# Patient Record
Sex: Male | Born: 1955 | Race: White | Hispanic: No | Marital: Married | Smoking: Never smoker
Health system: Southern US, Community
[De-identification: ages and names within clinical notes are randomized; demographics above are authoritative.]

## PROBLEM LIST (undated history)

## (undated) DIAGNOSIS — E785 Hyperlipidemia, unspecified: Secondary | ICD-10-CM

## (undated) HISTORY — PX: VASECTOMY: SHX75

## (undated) HISTORY — DX: Hyperlipidemia, unspecified: E78.5

---

## 2005-01-10 ENCOUNTER — Ambulatory Visit (HOSPITAL_COMMUNITY): Admission: RE | Admit: 2005-01-10 | Discharge: 2005-01-10 | Payer: Self-pay | Admitting: *Deleted

## 2007-09-07 ENCOUNTER — Emergency Department (HOSPITAL_COMMUNITY): Admission: EM | Admit: 2007-09-07 | Discharge: 2007-09-07 | Payer: Self-pay | Admitting: Emergency Medicine

## 2010-10-30 ENCOUNTER — Other Ambulatory Visit: Payer: Self-pay | Admitting: Internal Medicine

## 2010-10-30 DIAGNOSIS — J910 Malignant pleural effusion: Secondary | ICD-10-CM

## 2010-11-01 ENCOUNTER — Ambulatory Visit (HOSPITAL_COMMUNITY): Admission: RE | Admit: 2010-11-01 | Payer: Self-pay | Source: Ambulatory Visit

## 2010-12-13 NOTE — H&P (Signed)
NAME:  Thomas Benjamin, Thomas Benjamin NO.:  [id number]   MEDICAL RECORD NO.:  [id number]           PATIENT TYPE:  AMB   LOCATION:                                FACILITY:  [facility]   PHYSICIAN:  Dalia Heading, M.D.  DATE OF BIRTH:  1956/01/03   DATE OF ADMISSION:  DATE OF DISCHARGE:  LH                                HISTORY & PHYSICAL   CHIEF COMPLAINT:  Need for screening colonoscopy.   HISTORY OF PRESENT ILLNESS:  The patient is a 55 year old white male who is  referred for endoscopic evaluation.  He needs a colonoscopy for screening  purposes.  No abdominal pain, weight loss, nausea, vomiting, diarrhea,  constipation, melena, hematochezia have been noted. He has never had a  colonoscopy.  There is no family history of colon carcinoma.   PAST MEDICAL HISTORY:  Unremarkable.   PAST SURGICAL HISTORY:  Unremarkable.   CURRENT MEDICATIONS:  None.   ALLERGIES:  No known drug allergies.   REVIEW OF SYMPTOMS:  Noncontributory.   PHYSICAL EXAMINATION:  GENERAL APPEARANCE:  The patient is a well-developed,  well-nourished white male in no acute distress.  VITAL SIGNS:  Patient is afebrile and other vital signs are stable.  LUNGS:  Clear to auscultation with equal breath sounds bilaterally.  HEART:  Examination reveals regular rate and rhythm without S3, S4 or  murmurs.  ABDOMEN:  Soft, nontender, nondistended, no hepatosplenomegaly or masses are  noted.  Rectal examination is deferred to the procedure.   IMPRESSION:  Need for screening colonoscopy.   PLAN:  The patient is scheduled for a colonoscopy on January 10, 2005.  The  risks and benefits of the procedure including bleeding and perforation were  fully explained to the patient, who gave informed consent.       MAJ/MEDQ  D:  01/09/2005  T:  01/09/2005  Job:  [id number]   cc:   Kirk Ruths, M.D.  [address]  [place]  [place] [address]  Fax: [phone]

## 2018-11-25 DIAGNOSIS — Z6827 Body mass index (BMI) 27.0-27.9, adult: Secondary | ICD-10-CM | POA: Diagnosis not present

## 2018-11-25 DIAGNOSIS — Z0000 Encounter for general adult medical examination without abnormal findings: Secondary | ICD-10-CM | POA: Diagnosis not present

## 2018-11-25 DIAGNOSIS — Z125 Encounter for screening for malignant neoplasm of prostate: Secondary | ICD-10-CM | POA: Diagnosis not present

## 2018-11-25 DIAGNOSIS — E663 Overweight: Secondary | ICD-10-CM | POA: Diagnosis not present

## 2018-11-25 DIAGNOSIS — Z1389 Encounter for screening for other disorder: Secondary | ICD-10-CM | POA: Diagnosis not present

## 2019-01-17 ENCOUNTER — Other Ambulatory Visit: Payer: Self-pay

## 2019-01-17 ENCOUNTER — Emergency Department (HOSPITAL_COMMUNITY)
Admission: EM | Admit: 2019-01-17 | Discharge: 2019-01-17 | Disposition: A | Discharge status: Home / Self Care | Payer: Medicare HMO | Attending: Emergency Medicine | Admitting: Emergency Medicine

## 2019-01-17 ENCOUNTER — Emergency Department (HOSPITAL_COMMUNITY): Payer: Medicare HMO

## 2019-01-17 DIAGNOSIS — R51 Headache: Secondary | ICD-10-CM | POA: Diagnosis not present

## 2019-01-17 DIAGNOSIS — W228XXA Striking against or struck by other objects, initial encounter: Secondary | ICD-10-CM | POA: Diagnosis not present

## 2019-01-17 DIAGNOSIS — S0990XA Unspecified injury of head, initial encounter: Secondary | ICD-10-CM | POA: Insufficient documentation

## 2019-01-17 DIAGNOSIS — Y939 Activity, unspecified: Secondary | ICD-10-CM | POA: Diagnosis not present

## 2019-01-17 DIAGNOSIS — Y93H3 Activity, building and construction: Secondary | ICD-10-CM | POA: Diagnosis not present

## 2019-01-17 DIAGNOSIS — Y999 Unspecified external cause status: Secondary | ICD-10-CM | POA: Insufficient documentation

## 2019-01-17 DIAGNOSIS — Y929 Unspecified place or not applicable: Secondary | ICD-10-CM | POA: Diagnosis not present

## 2019-01-17 NOTE — Discharge Instructions (Addendum)
Your CT scan here today was normal.  Take Tylenol and Motrin for pain.  Return here as needed.

## 2019-01-17 NOTE — ED Triage Notes (Signed)
Pt. Stated, I was working on a building and a piece of wood fell on  The top of my head . On Friday I started having a headache about 24 hours later. Ive had a headache ever since off and on.Some nausea but not much. I was checking to see if I have a concussion.

## 2019-01-17 NOTE — ED Notes (Signed)
Pt verbalized understanding of d/c instructions and has no further questions, VSS, NAD.  

## 2019-01-17 NOTE — ED Provider Notes (Signed)
  [facility] EMERGENCY DEPARTMENT Provider Note   CSN: [id number] Arrival date & time: 01/17/19  0720     History   Chief Complaint Chief Complaint  Patient presents with  . Headache  . Head Injury    HPI Thomas Benjamin is a 63 y.o. male.     HPI Patient presents to the emergency department with headache following a head injury that occurred on Thursday.  The patient states that he is building a shed in his backyard when he got hit in the head with a board that had a nail sticking out.  Patient states he did not have any laceration or wound to the head.  Patient states initially he did not have headache but developed this the next day.  Patient states that he did not take any medications prior to arrival for his symptoms.  Patient was concerned because he continues to have this waxing and waning headache since that time.  The patient denies chest pain, shortness of breath, blurred vision, neck pain, fever,  weakness, numbness, dizziness, anorexia, nausea, vomiting, near syncope, or syncope. No past medical history on file.  Patient Active Problem List   Diagnosis Date Noted  . 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  Social History Social History   Tobacco Use  . Smoking status: Not on file  Substance Use Topics  . Alcohol use: Not on file  . Drug use: Not on file     Allergies   Patient has no allergy information on record.   Review of Systems Review of Systems   Physical Exam Updated Vital Signs BP (!) 141/80   Pulse 78   Temp 99.4 F (37.4 C)   Resp 17   SpO2 95%   Physical Exam HENT:     Head:       ED Treatments / Results  Labs (all labs ordered are listed, but only abnormal results are displayed) Labs Reviewed - No data to display  EKG    Radiology No results found.  Procedures Procedures  (including critical care time)  Medications Ordered in ED Medications - No data to display   Initial Impression / Assessment and Plan / ED Course  I have reviewed the triage vital signs and the nursing notes.  Pertinent labs & imaging results that were available during my care of the patient were reviewed by me and considered in my medical decision making (see chart for details).        Patient will be treated for minor head injury.  The patient CT scan did not show any significant abnormalities at this time.  Patient is neurologically intact.  Patient will be advised to use Tylenol and Motrin for any headaches.  Final Clinical Impressions(s) / ED Diagnoses   Final diagnoses:  None    ED Discharge Orders    None       Dalia Heading, PA-C 01/17/19 [place], MD 01/17/19 1144

## 2019-01-20 DIAGNOSIS — W57XXXA Bitten or stung by nonvenomous insect and other nonvenomous arthropods, initial encounter: Secondary | ICD-10-CM | POA: Diagnosis not present

## 2019-01-20 DIAGNOSIS — E663 Overweight: Secondary | ICD-10-CM | POA: Diagnosis not present

## 2019-01-20 DIAGNOSIS — R51 Headache: Secondary | ICD-10-CM | POA: Diagnosis not present

## 2019-01-20 DIAGNOSIS — Z6827 Body mass index (BMI) 27.0-27.9, adult: Secondary | ICD-10-CM | POA: Diagnosis not present

## 2019-01-20 DIAGNOSIS — R509 Fever, unspecified: Secondary | ICD-10-CM | POA: Diagnosis not present

## 2019-02-01 ENCOUNTER — Ambulatory Visit (INDEPENDENT_AMBULATORY_CARE_PROVIDER_SITE_OTHER): Payer: Medicare HMO | Admitting: Urology

## 2019-02-01 DIAGNOSIS — R972 Elevated prostate specific antigen [PSA]: Secondary | ICD-10-CM | POA: Diagnosis not present

## 2019-02-02 ENCOUNTER — Other Ambulatory Visit (HOSPITAL_COMMUNITY): Payer: Self-pay | Admitting: Urology

## 2019-02-02 DIAGNOSIS — R972 Elevated prostate specific antigen [PSA]: Secondary | ICD-10-CM

## 2019-02-22 ENCOUNTER — Encounter (HOSPITAL_COMMUNITY): Payer: Self-pay

## 2019-02-22 ENCOUNTER — Other Ambulatory Visit: Payer: Self-pay | Admitting: Urology

## 2019-02-22 ENCOUNTER — Ambulatory Visit (HOSPITAL_COMMUNITY)
Admission: RE | Admit: 2019-02-22 | Discharge: 2019-02-22 | Disposition: A | Discharge status: Home / Self Care | Payer: Medicare HMO | Source: Ambulatory Visit | Attending: Urology | Admitting: Urology

## 2019-02-22 ENCOUNTER — Other Ambulatory Visit: Payer: Self-pay

## 2019-02-22 DIAGNOSIS — R972 Elevated prostate specific antigen [PSA]: Secondary | ICD-10-CM

## 2019-02-22 DIAGNOSIS — C61 Malignant neoplasm of prostate: Secondary | ICD-10-CM | POA: Diagnosis not present

## 2019-02-22 DIAGNOSIS — N4232 Atypical small acinar proliferation of prostate: Secondary | ICD-10-CM | POA: Diagnosis not present

## 2019-02-22 MED ORDER — GENTAMICIN SULFATE 40 MG/ML IJ SOLN
INTRAMUSCULAR | Status: AC
  Administered 2019-02-22: 160 mg via INTRAMUSCULAR
  Filled 2019-02-22: qty 4

## 2019-02-22 MED ORDER — LIDOCAINE HCL (PF) 2 % IJ SOLN
INTRAMUSCULAR | Status: AC
  Administered 2019-02-22: 10 mL
  Filled 2019-02-22: qty 10

## 2019-02-22 MED ORDER — LIDOCAINE HCL (PF) 2 % IJ SOLN
10.0000 mL | Freq: Once | INTRAMUSCULAR | Status: AC
  Administered 2019-02-22: 10 mL

## 2019-02-22 MED ORDER — GENTAMICIN SULFATE 40 MG/ML IJ SOLN
160.0000 mg | Freq: Once | INTRAMUSCULAR | Status: AC
  Administered 2019-02-22: 160 mg via INTRAMUSCULAR

## 2019-03-08 ENCOUNTER — Ambulatory Visit (INDEPENDENT_AMBULATORY_CARE_PROVIDER_SITE_OTHER): Payer: Medicare HMO | Admitting: Urology

## 2019-03-08 DIAGNOSIS — C61 Malignant neoplasm of prostate: Secondary | ICD-10-CM | POA: Diagnosis not present

## 2019-03-18 DIAGNOSIS — N4232 Atypical small acinar proliferation of prostate: Secondary | ICD-10-CM | POA: Diagnosis not present

## 2019-03-18 DIAGNOSIS — C61 Malignant neoplasm of prostate: Secondary | ICD-10-CM | POA: Diagnosis not present

## 2019-04-14 DIAGNOSIS — C61 Malignant neoplasm of prostate: Secondary | ICD-10-CM | POA: Diagnosis not present

## 2019-04-14 DIAGNOSIS — Z01818 Encounter for other preprocedural examination: Secondary | ICD-10-CM | POA: Diagnosis not present

## 2019-04-27 DIAGNOSIS — Z20828 Contact with and (suspected) exposure to other viral communicable diseases: Secondary | ICD-10-CM | POA: Diagnosis not present

## 2019-04-27 DIAGNOSIS — C61 Malignant neoplasm of prostate: Secondary | ICD-10-CM | POA: Diagnosis not present

## 2019-05-10 DIAGNOSIS — S80819A Abrasion, unspecified lower leg, initial encounter: Secondary | ICD-10-CM | POA: Diagnosis not present

## 2019-05-10 DIAGNOSIS — Z79899 Other long term (current) drug therapy: Secondary | ICD-10-CM | POA: Diagnosis not present

## 2019-05-10 DIAGNOSIS — N393 Stress incontinence (female) (male): Secondary | ICD-10-CM | POA: Diagnosis not present

## 2019-05-10 DIAGNOSIS — Z8546 Personal history of malignant neoplasm of prostate: Secondary | ICD-10-CM | POA: Diagnosis not present

## 2019-05-10 DIAGNOSIS — Z9079 Acquired absence of other genital organ(s): Secondary | ICD-10-CM | POA: Diagnosis not present

## 2019-05-10 DIAGNOSIS — N5231 Erectile dysfunction following radical prostatectomy: Secondary | ICD-10-CM | POA: Diagnosis not present

## 2019-05-10 DIAGNOSIS — C61 Malignant neoplasm of prostate: Secondary | ICD-10-CM | POA: Diagnosis not present

## 2019-05-10 DIAGNOSIS — Z08 Encounter for follow-up examination after completed treatment for malignant neoplasm: Secondary | ICD-10-CM | POA: Diagnosis not present

## 2019-05-10 DIAGNOSIS — Z4889 Encounter for other specified surgical aftercare: Secondary | ICD-10-CM | POA: Diagnosis not present

## 2019-05-10 DIAGNOSIS — Z803 Family history of malignant neoplasm of breast: Secondary | ICD-10-CM | POA: Diagnosis not present

## 2019-05-10 DIAGNOSIS — L089 Local infection of the skin and subcutaneous tissue, unspecified: Secondary | ICD-10-CM | POA: Diagnosis not present

## 2019-05-10 DIAGNOSIS — N3946 Mixed incontinence: Secondary | ICD-10-CM | POA: Diagnosis not present

## 2019-05-10 DIAGNOSIS — X58XXXA Exposure to other specified factors, initial encounter: Secondary | ICD-10-CM | POA: Diagnosis not present

## 2019-08-05 DIAGNOSIS — N5231 Erectile dysfunction following radical prostatectomy: Secondary | ICD-10-CM | POA: Diagnosis not present

## 2019-08-05 DIAGNOSIS — N3946 Mixed incontinence: Secondary | ICD-10-CM | POA: Diagnosis not present

## 2019-08-05 DIAGNOSIS — C61 Malignant neoplasm of prostate: Secondary | ICD-10-CM | POA: Diagnosis not present

## 2019-09-17 DIAGNOSIS — C61 Malignant neoplasm of prostate: Secondary | ICD-10-CM | POA: Diagnosis not present

## 2019-11-15 DIAGNOSIS — N3946 Mixed incontinence: Secondary | ICD-10-CM | POA: Diagnosis not present

## 2019-11-15 DIAGNOSIS — N5231 Erectile dysfunction following radical prostatectomy: Secondary | ICD-10-CM | POA: Diagnosis not present

## 2019-11-15 DIAGNOSIS — C61 Malignant neoplasm of prostate: Secondary | ICD-10-CM | POA: Diagnosis not present

## 2019-12-05 DIAGNOSIS — R69 Illness, unspecified: Secondary | ICD-10-CM | POA: Diagnosis not present

## 2019-12-14 DIAGNOSIS — R69 Illness, unspecified: Secondary | ICD-10-CM | POA: Diagnosis not present

## 2020-01-27 IMAGING — CT CT HEAD WITHOUT CONTRAST
4 series · 17 of 47 positions shown, 19 images · non-contrast
Comparison: None.

CLINICAL DATA: Posttraumatic headache due to fall a week ago

EXAM:
CT HEAD WITHOUT CONTRAST
TECHNIQUE: Contiguous axial images were obtained from the base of the skull
through the vertex without intravenous contrast.

[Series 3: head without · axial · non-contrast · 0.44mm/px · z∈[-94,+31]mm · 7 of 35 slices shown, 9 images]
[im 5/35  brain]
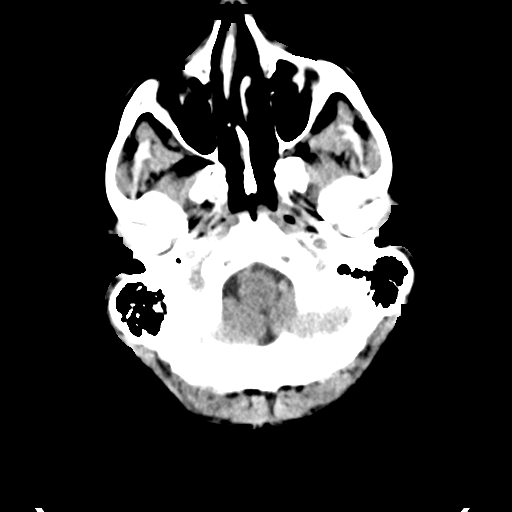
[im 5/35  bone]
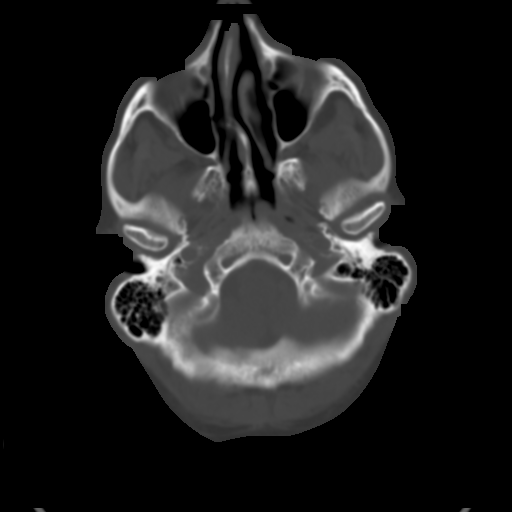
[im 9/35  brain]
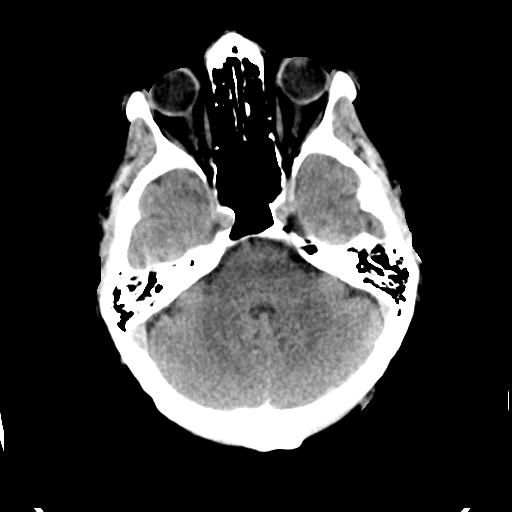
[im 13/35  brain]
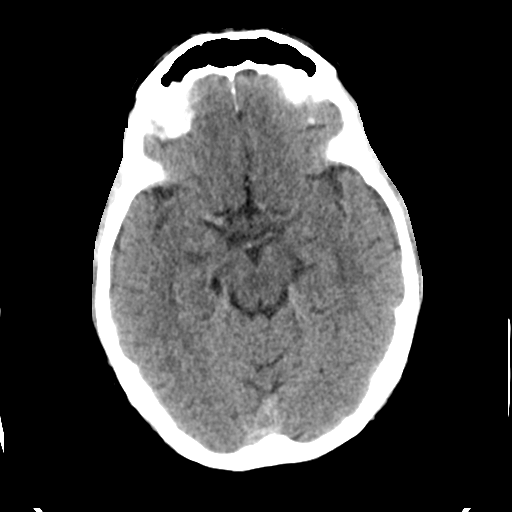
[im 18/35  brain]
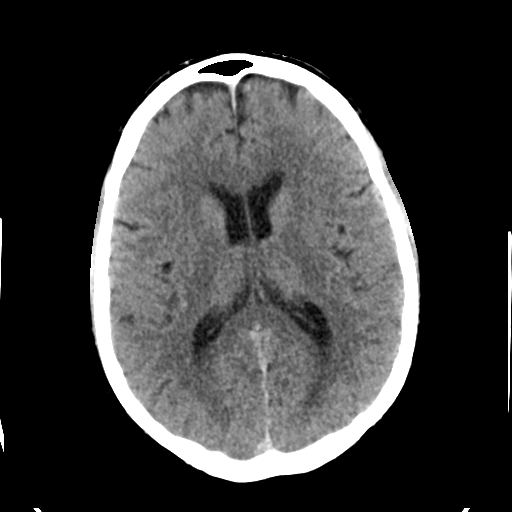
[im 22/35  brain]
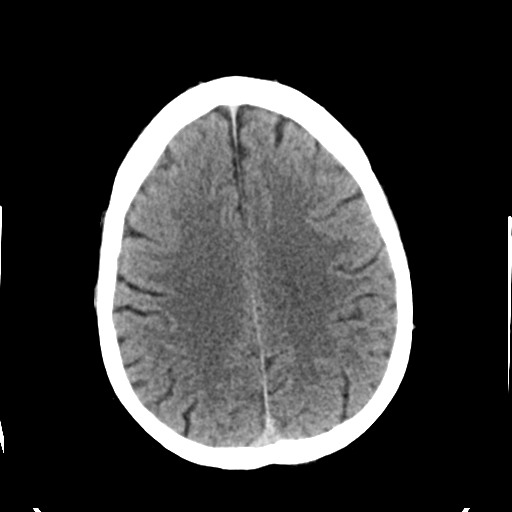
[im 22/35  bone]
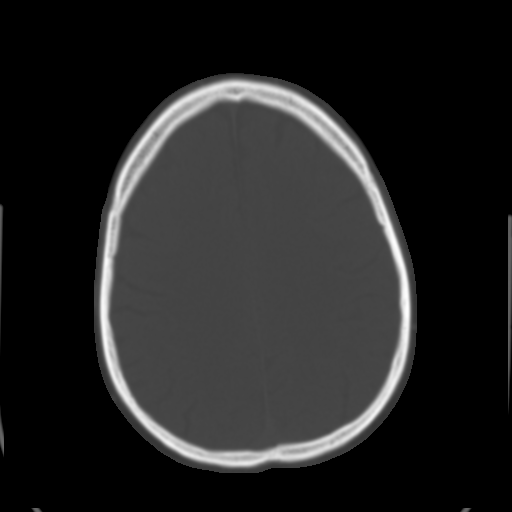
[im 26/35  brain]
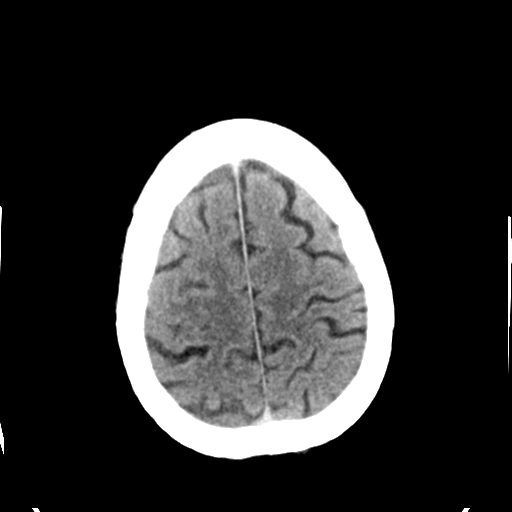
[im 30/35  brain]
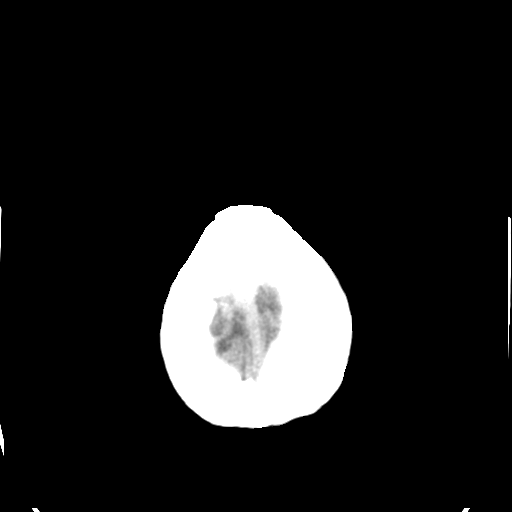

[Series 4: head bone · axial · 0.44mm/px · z∈[-98,-38]mm · 4 of 87 slices shown]
[im 9/87  bone]
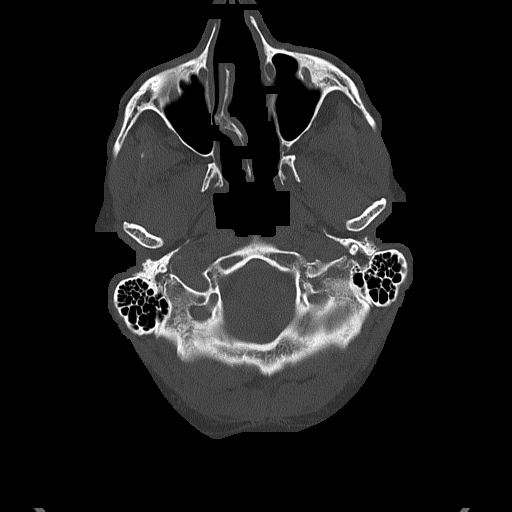
[im 18/87  bone]
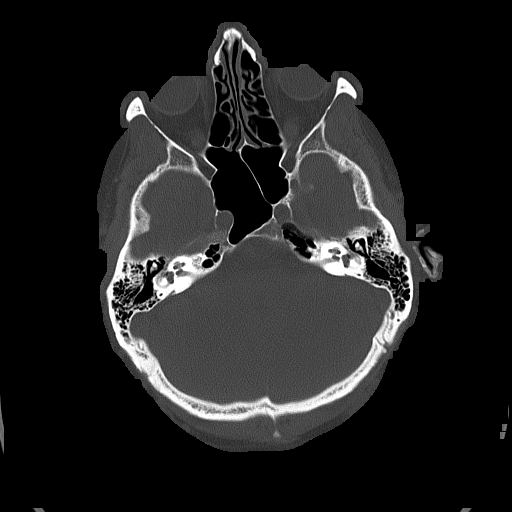
[im 26/87  bone]
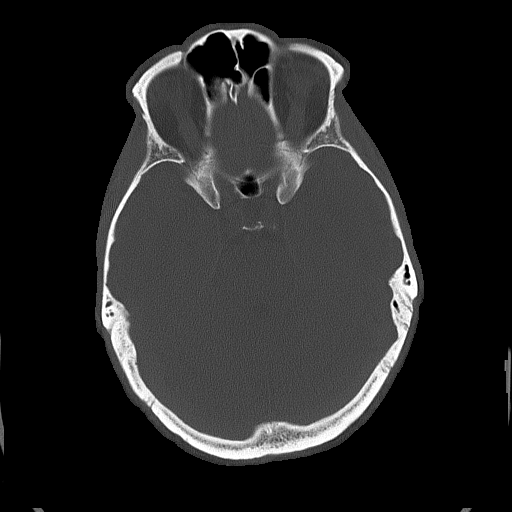
[im 39/87  bone]
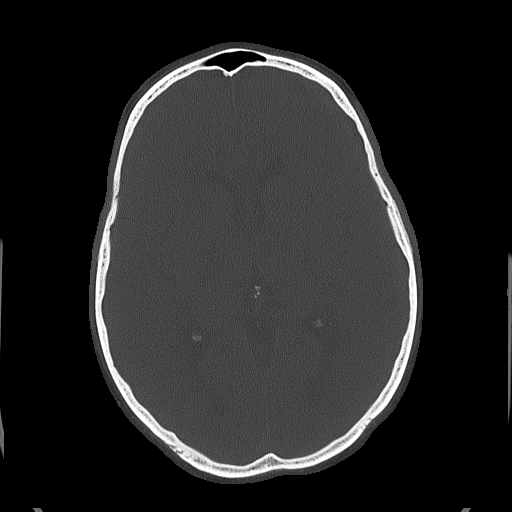

[Series 5: head without cor · coronal · non-contrast · 0.34mm/px · 3 of 74 slices shown]
[im 25/74  brain]
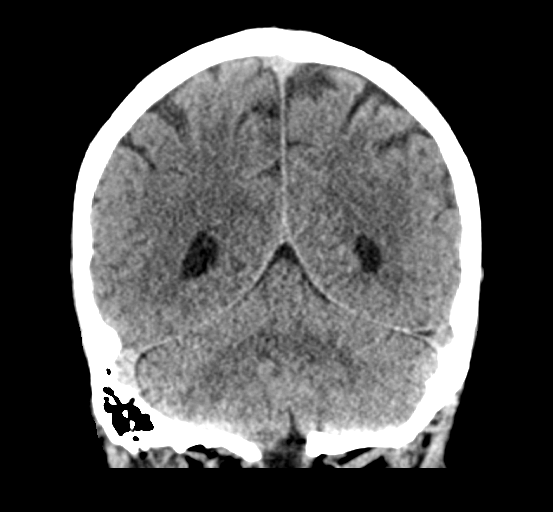
[im 33/74  brain]
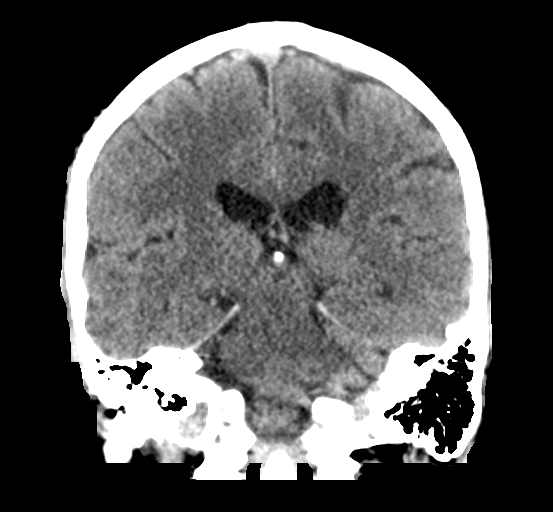
[im 41/74  brain]
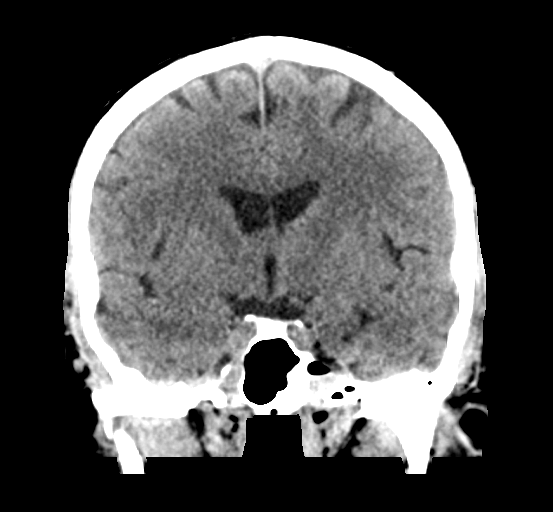

[Series 6: head without sag · sagittal · non-contrast · 0.34mm/px · 3 of 67 slices shown]
[im 23/67  brain]
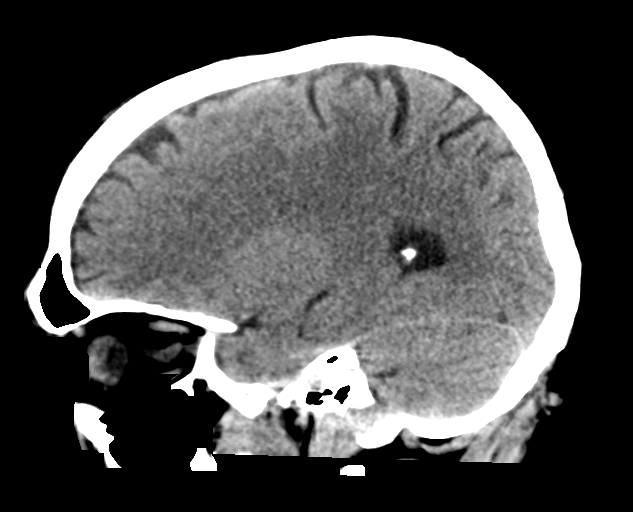
[im 34/67  brain]
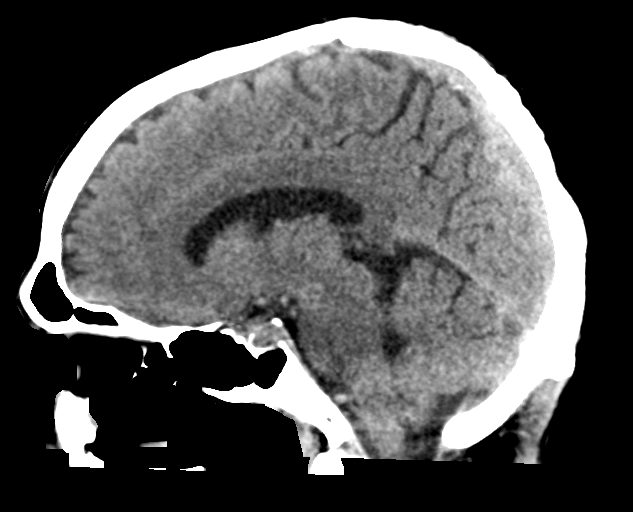
[im 45/67  brain]
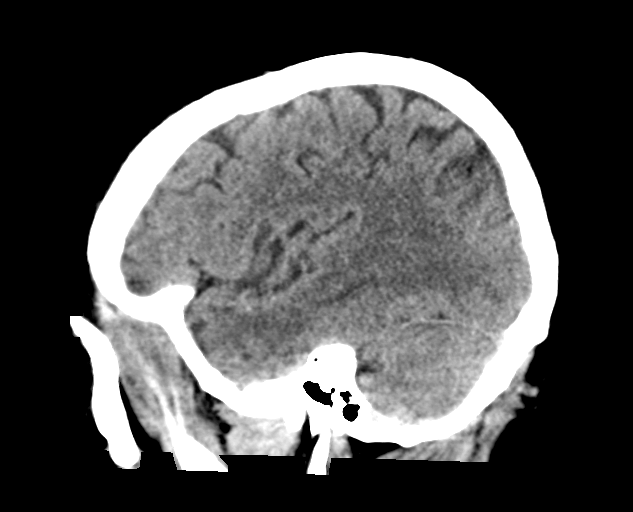

[17 of 47 positions shown; findings below may reference images not displayed]

FINDINGS: Brain: No evidence of acute infarction, hemorrhage, hydrocephalus,
extra-axial collection or mass lesion/mass effect.

Vascular: No hyperdense vessel or unexpected calcification.

Skull: Normal. Negative for fracture or focal lesion.

Sinuses/Orbits: No acute finding.
IMPRESSION: Negative head CT

## 2020-02-14 DIAGNOSIS — C61 Malignant neoplasm of prostate: Secondary | ICD-10-CM | POA: Diagnosis not present

## 2020-02-14 DIAGNOSIS — N5231 Erectile dysfunction following radical prostatectomy: Secondary | ICD-10-CM | POA: Diagnosis not present

## 2020-08-30 DIAGNOSIS — C61 Malignant neoplasm of prostate: Secondary | ICD-10-CM | POA: Diagnosis not present

## 2020-08-30 DIAGNOSIS — N5231 Erectile dysfunction following radical prostatectomy: Secondary | ICD-10-CM | POA: Diagnosis not present

## 2020-11-06 DIAGNOSIS — Z0100 Encounter for examination of eyes and vision without abnormal findings: Secondary | ICD-10-CM | POA: Diagnosis not present

## 2020-11-06 DIAGNOSIS — H5200 Hypermetropia, unspecified eye: Secondary | ICD-10-CM | POA: Diagnosis not present

## 2020-11-21 DIAGNOSIS — Z1331 Encounter for screening for depression: Secondary | ICD-10-CM | POA: Diagnosis not present

## 2020-11-21 DIAGNOSIS — Z0000 Encounter for general adult medical examination without abnormal findings: Secondary | ICD-10-CM | POA: Diagnosis not present

## 2020-11-21 DIAGNOSIS — Z6828 Body mass index (BMI) 28.0-28.9, adult: Secondary | ICD-10-CM | POA: Diagnosis not present

## 2021-02-07 DIAGNOSIS — N5231 Erectile dysfunction following radical prostatectomy: Secondary | ICD-10-CM | POA: Diagnosis not present

## 2021-02-07 DIAGNOSIS — C61 Malignant neoplasm of prostate: Secondary | ICD-10-CM | POA: Diagnosis not present

## 2021-09-05 DIAGNOSIS — C61 Malignant neoplasm of prostate: Secondary | ICD-10-CM | POA: Diagnosis not present

## 2021-09-05 DIAGNOSIS — N5231 Erectile dysfunction following radical prostatectomy: Secondary | ICD-10-CM | POA: Diagnosis not present

## 2021-10-14 DIAGNOSIS — J0100 Acute maxillary sinusitis, unspecified: Secondary | ICD-10-CM | POA: Diagnosis not present

## 2021-10-14 DIAGNOSIS — E663 Overweight: Secondary | ICD-10-CM | POA: Diagnosis not present

## 2021-10-14 DIAGNOSIS — R0982 Postnasal drip: Secondary | ICD-10-CM | POA: Diagnosis not present

## 2021-10-14 DIAGNOSIS — Z6828 Body mass index (BMI) 28.0-28.9, adult: Secondary | ICD-10-CM | POA: Diagnosis not present

## 2022-06-26 DIAGNOSIS — H5203 Hypermetropia, bilateral: Secondary | ICD-10-CM | POA: Diagnosis not present

## 2022-09-11 DIAGNOSIS — Z9079 Acquired absence of other genital organ(s): Secondary | ICD-10-CM | POA: Diagnosis not present

## 2022-09-11 DIAGNOSIS — N5231 Erectile dysfunction following radical prostatectomy: Secondary | ICD-10-CM | POA: Diagnosis not present

## 2022-09-11 DIAGNOSIS — C61 Malignant neoplasm of prostate: Secondary | ICD-10-CM | POA: Diagnosis not present

## 2022-09-29 DIAGNOSIS — Z6828 Body mass index (BMI) 28.0-28.9, adult: Secondary | ICD-10-CM | POA: Diagnosis not present

## 2022-09-29 DIAGNOSIS — Z125 Encounter for screening for malignant neoplasm of prostate: Secondary | ICD-10-CM | POA: Diagnosis not present

## 2022-09-29 DIAGNOSIS — Z0001 Encounter for general adult medical examination with abnormal findings: Secondary | ICD-10-CM | POA: Diagnosis not present

## 2022-09-29 DIAGNOSIS — E663 Overweight: Secondary | ICD-10-CM | POA: Diagnosis not present

## 2022-09-29 DIAGNOSIS — E7849 Other hyperlipidemia: Secondary | ICD-10-CM | POA: Diagnosis not present

## 2022-09-29 DIAGNOSIS — Z1331 Encounter for screening for depression: Secondary | ICD-10-CM | POA: Diagnosis not present

## 2022-09-29 DIAGNOSIS — R7309 Other abnormal glucose: Secondary | ICD-10-CM | POA: Diagnosis not present

## 2022-10-27 ENCOUNTER — Ambulatory Visit: Payer: Medicare HMO | Admitting: General Surgery

## 2022-10-27 ENCOUNTER — Encounter: Payer: Self-pay | Admitting: General Surgery

## 2022-10-27 VITALS — BP 169/68 | HR 68 | Temp 97.9°F | Resp 14 | Ht 72.0 in | Wt 200.0 lb

## 2022-10-27 DIAGNOSIS — Z1211 Encounter for screening for malignant neoplasm of colon: Secondary | ICD-10-CM | POA: Diagnosis not present

## 2022-10-27 MED ORDER — SUTAB 1479-225-188 MG PO TABS: 24.0000 | ORAL_TABLET | Freq: Once | ORAL | 0 refills | Status: AC

## 2022-10-27 NOTE — H&P (Signed)
Thomas Benjamin; [id number]; Sep 18, 1955   HPI Patient is a 67 year old white male who was referred to my care by Dr. Hilma Favors for a screening colonoscopy.  He last had a colonoscopy in 2006.  Since that time, he has been treated for prostate cancer.  He denies any family history of colon cancer.  He denies any blood per rectum, abdominal pain, diarrhea, constipation, or significant bowel changes. Past Medical History:  Diagnosis Date   Hyperlipidemia     Past Surgical History:  Procedure Laterality Date   VASECTOMY      History reviewed. No pertinent family history.  No current outpatient medications on file prior to visit.   No current facility-administered medications on file prior to visit.    No Known Allergies  Social History   Substance and Sexual Activity  Alcohol Use Not Currently    Social History   Tobacco Use  Smoking Status Never  Smokeless Tobacco Never    Review of Systems  Constitutional: Negative.   HENT:  Positive for sinus pain.   Eyes: Negative.   Respiratory: Negative.    Cardiovascular: Negative.   Gastrointestinal: Negative.   Genitourinary: Negative.   Musculoskeletal: Negative.   Skin: Negative.   Neurological: Negative.   Endo/Heme/Allergies: Negative.   Psychiatric/Behavioral: Negative.      Objective   Vitals:   10/27/22 0920  BP: (!) 169/68  Pulse: 68  Resp: 14  Temp: 97.9 F (36.6 C)  SpO2: 96%    Physical Exam Vitals reviewed.  Constitutional:      Appearance: Normal appearance. He is normal weight. He is not ill-appearing.  HENT:     Head: Normocephalic and atraumatic.  Cardiovascular:     Rate and Rhythm: Normal rate and regular rhythm.     Heart sounds: Normal heart sounds. No murmur heard.    No friction rub. No gallop.  Pulmonary:     Effort: Pulmonary effort is normal. No respiratory distress.     Breath sounds: Normal breath sounds. No stridor. No wheezing, rhonchi or rales.  Abdominal:     General: Bowel  sounds are normal. There is no distension.     Palpations: Abdomen is soft. There is no mass.     Tenderness: There is no abdominal tenderness. There is no guarding or rebound.     Hernia: No hernia is present.  Skin:    General: Skin is warm and dry.  Neurological:     Mental Status: He is alert and oriented to person, place, and time.    Primary care notes reviewed Assessment  Need for screening colonoscopy Plan  Patient is scheduled for a screening colonoscopy on 11/04/2022.  The risks and benefits of the procedure including bleeding and perforation were fully explained to the patient, who gave informed consent.  If his colonoscopy is clean, he will not need another screening colonoscopy due to age.  Sutabs have been prescribed.

## 2022-10-27 NOTE — Progress Notes (Signed)
Thomas Benjamin; [id number]; 10-Nov-1955   HPI Patient is a 67 year old white male who was referred to my care by Dr. Hilma Favors for a screening colonoscopy.  He last had a colonoscopy in 2006.  Since that time, he has been treated for prostate cancer.  He denies any family history of colon cancer.  He denies any blood per rectum, abdominal pain, diarrhea, constipation, or significant bowel changes. Past Medical History:  Diagnosis Date   Hyperlipidemia     Past Surgical History:  Procedure Laterality Date   VASECTOMY      History reviewed. No pertinent family history.  No current outpatient medications on file prior to visit.   No current facility-administered medications on file prior to visit.    No Known Allergies  Social History   Substance and Sexual Activity  Alcohol Use Not Currently    Social History   Tobacco Use  Smoking Status Never  Smokeless Tobacco Never    Review of Systems  Constitutional: Negative.   HENT:  Positive for sinus pain.   Eyes: Negative.   Respiratory: Negative.    Cardiovascular: Negative.   Gastrointestinal: Negative.   Genitourinary: Negative.   Musculoskeletal: Negative.   Skin: Negative.   Neurological: Negative.   Endo/Heme/Allergies: Negative.   Psychiatric/Behavioral: Negative.      Objective   Vitals:   10/27/22 0920  BP: (!) 169/68  Pulse: 68  Resp: 14  Temp: 97.9 F (36.6 C)  SpO2: 96%    Physical Exam Vitals reviewed.  Constitutional:      Appearance: Normal appearance. He is normal weight. He is not ill-appearing.  HENT:     Head: Normocephalic and atraumatic.  Cardiovascular:     Rate and Rhythm: Normal rate and regular rhythm.     Heart sounds: Normal heart sounds. No murmur heard.    No friction rub. No gallop.  Pulmonary:     Effort: Pulmonary effort is normal. No respiratory distress.     Breath sounds: Normal breath sounds. No stridor. No wheezing, rhonchi or rales.  Abdominal:     General: Bowel  sounds are normal. There is no distension.     Palpations: Abdomen is soft. There is no mass.     Tenderness: There is no abdominal tenderness. There is no guarding or rebound.     Hernia: No hernia is present.  Skin:    General: Skin is warm and dry.  Neurological:     Mental Status: He is alert and oriented to person, place, and time.    Primary care notes reviewed Assessment  Need for screening colonoscopy Plan  Patient is scheduled for a screening colonoscopy on 11/04/2022.  The risks and benefits of the procedure including bleeding and perforation were fully explained to the patient, who gave informed consent.  If his colonoscopy is clean, he will not need another screening colonoscopy due to age.  Sutabs have been prescribed.

## 2022-11-04 ENCOUNTER — Ambulatory Visit (HOSPITAL_COMMUNITY): Payer: Medicare HMO | Admitting: Certified Registered Nurse Anesthetist

## 2022-11-04 ENCOUNTER — Ambulatory Visit (HOSPITAL_COMMUNITY)
Admission: RE | Admit: 2022-11-04 | Discharge: 2022-11-04 | Disposition: A | Discharge status: Home / Self Care | Payer: Medicare HMO | Attending: General Surgery | Admitting: General Surgery

## 2022-11-04 ENCOUNTER — Ambulatory Visit (HOSPITAL_BASED_OUTPATIENT_CLINIC_OR_DEPARTMENT_OTHER): Payer: Medicare HMO | Admitting: Certified Registered Nurse Anesthetist

## 2022-11-04 ENCOUNTER — Encounter (HOSPITAL_COMMUNITY): Payer: Self-pay | Admitting: General Surgery

## 2022-11-04 ENCOUNTER — Other Ambulatory Visit: Payer: Self-pay

## 2022-11-04 ENCOUNTER — Encounter (HOSPITAL_COMMUNITY)
Admission: RE | Disposition: A | Discharge status: Home / Self Care | Payer: Self-pay | Source: Home / Self Care | Attending: General Surgery

## 2022-11-04 ENCOUNTER — Ambulatory Visit: Payer: Medicare HMO | Admitting: General Surgery

## 2022-11-04 DIAGNOSIS — K5790 Diverticulosis of intestine, part unspecified, without perforation or abscess without bleeding: Secondary | ICD-10-CM | POA: Diagnosis not present

## 2022-11-04 DIAGNOSIS — K640 First degree hemorrhoids: Secondary | ICD-10-CM | POA: Diagnosis not present

## 2022-11-04 DIAGNOSIS — Z1211 Encounter for screening for malignant neoplasm of colon: Secondary | ICD-10-CM | POA: Diagnosis not present

## 2022-11-04 DIAGNOSIS — Z8546 Personal history of malignant neoplasm of prostate: Secondary | ICD-10-CM | POA: Insufficient documentation

## 2022-11-04 DIAGNOSIS — K648 Other hemorrhoids: Secondary | ICD-10-CM

## 2022-11-04 DIAGNOSIS — K5730 Diverticulosis of large intestine without perforation or abscess without bleeding: Secondary | ICD-10-CM

## 2022-11-04 DIAGNOSIS — E785 Hyperlipidemia, unspecified: Secondary | ICD-10-CM | POA: Insufficient documentation

## 2022-11-04 HISTORY — PX: COLONOSCOPY WITH PROPOFOL: SHX5780

## 2022-11-04 SURGERY — COLONOSCOPY WITH PROPOFOL
Anesthesia: General

## 2022-11-04 MED ORDER — PROPOFOL 10 MG/ML IV BOLUS
INTRAVENOUS | Status: DC | PRN
  Administered 2022-11-04: 100 mg via INTRAVENOUS

## 2022-11-04 MED ORDER — LACTATED RINGERS IV SOLN: INTRAVENOUS | Status: DC

## 2022-11-04 MED ORDER — PROPOFOL 500 MG/50ML IV EMUL
INTRAVENOUS | Status: DC | PRN
  Administered 2022-11-04: 150 ug/kg/min via INTRAVENOUS

## 2022-11-04 MED ORDER — LIDOCAINE HCL (CARDIAC) PF 100 MG/5ML IV SOSY
PREFILLED_SYRINGE | INTRAVENOUS | Status: DC | PRN
  Administered 2022-11-04: 50 mg via INTRAVENOUS

## 2022-11-04 NOTE — Transfer of Care (Signed)
Immediate Anesthesia Transfer of Care Note  Patient: Thomas Benjamin  Procedure(s) Performed: COLONOSCOPY WITH PROPOFOL  Patient Location: Endoscopy Unit  Anesthesia Type:General  Level of Consciousness: drowsy  Airway & Oxygen Therapy: Patient Spontanous Breathing  Post-op Assessment: Report given to RN and Post -op Vital signs reviewed and stable  Post vital signs: Reviewed and stable  Last Vitals:  Vitals Value Taken Time  BP 93/43 11/04/22 0743  Temp 36.7 C 11/04/22 0743  Pulse 70 11/04/22 0743  Resp 18 11/04/22 0743  SpO2 94 % 11/04/22 0743    Last Pain:  Vitals:   11/04/22 0743  TempSrc: Oral  PainSc: 0-No pain      Patients Stated Pain Goal: 5 (11/04/22 3383)  Complications: No notable events documented.

## 2022-11-04 NOTE — Op Note (Signed)
[facility] Patient Name: Thomas Benjamin Procedure Date: 11/04/2022 7:21 AM MRN: [id number] Date of Birth: [birth year] Attending MD: Franky Macho , MD, [id number] CSN: [id number] Age: 67 Admit Type: Outpatient Procedure:                Colonoscopy Indications:              Screening for colorectal malignant neoplasm Providers:                Franky Macho, MD, Buel Ream. Museum/gallery exhibitions officer, Charity fundraiser,                            Judeth Cornfield. Jessee Avers, Technician Referring MD:              Medicines:                Propofol per Anesthesia Complications:            No immediate complications. Estimated Blood Loss:     Estimated blood loss: none. Procedure:                Pre-Anesthesia Assessment:                           - Prior to the procedure, a History and Physical                            was performed, and patient medications and                            allergies were reviewed. The patient is competent.                            The risks and benefits of the procedure and the                            sedation options and risks were discussed with the                            patient. All questions were answered and informed                            consent was obtained. Patient identification and                            proposed procedure were verified by the physician,                            the nurse, the anesthesiologist, the anesthetist                            and the technician in the endoscopy suite. Mental                            Status Examination: alert and oriented. Airway  Examination: normal oropharyngeal airway and neck                            mobility. Respiratory Examination: clear to                            auscultation. CV Examination: RRR, no murmurs, no                            S3 or S4. Prophylactic Antibiotics: The patient                            does not require prophylactic antibiotics. Prior                             Anticoagulants: The patient has taken no                            anticoagulant or antiplatelet agents. ASA Grade                            Assessment: II - A patient with mild systemic                            disease. After reviewing the risks and benefits,                            the patient was deemed in satisfactory condition to                            undergo the procedure. The anesthesia plan was to                            use deep sedation / analgesia. Immediately prior to                            administration of medications, the patient was                            re-assessed for adequacy to receive sedatives. The                            heart rate, respiratory rate, oxygen saturations,                            blood pressure, adequacy of pulmonary ventilation,                            and response to care were monitored throughout the                            procedure. The physical status of the patient was  re-assessed after the procedure.                           - Pre-procedure physical examination revealed no                            contraindications to sedation.                           After obtaining informed consent, the colonoscope                            was passed under direct vision. Throughout the                            procedure, the patient's blood pressure, pulse, and                            oxygen saturations were monitored continuously. The                            [id number]) scope was introduced through the                            anus and advanced to the the cecum, identified by                            the appendiceal orifice, ileocecal valve and                            palpation. No anatomical landmarks were                            photographed. The entire colon was well visualized.                            The colonoscopy was performed without difficulty.                             The patient tolerated the procedure well. The                            quality of the bowel preparation was adequate. Scope In: 7:33:59 AM Scope Out: 7:40:23 AM Scope Withdrawal Time: 0 hours 3 minutes 20 seconds  Total Procedure Duration: 0 hours 6 minutes 24 seconds  Findings:      The perianal and digital rectal examinations were normal.      A few small-mouthed diverticula were found in the sigmoid colon.       Estimated blood loss: none.      Internal hemorrhoids were found during retroflexion. The hemorrhoids       were mild and Grade I (internal hemorrhoids that do not prolapse).       Estimated blood loss: none.      The exam was otherwise without abnormality on direct and retroflexion       views. Impression:               -  Diverticulosis in the sigmoid colon.                           - Internal hemorrhoids.                           - The examination was otherwise normal on direct                            and retroflexion views.                           - No specimens collected. Moderate Sedation:      Moderate (conscious) sedation was administered by the nurse and       supervised by the endoscopist. The patient's oxygen saturation, heart       rate, blood pressure and response to care were monitored. Recommendation:           - Written discharge instructions were provided to                            the patient.                           - The signs and symptoms of potential delayed                            complications were discussed with the patient.                           - Patient has a contact number available for                            emergencies.                           - Return to normal activities tomorrow.                           - Resume previous diet.                           - Continue present medications.                           - No repeat colonoscopy due to current age (20                             years or older). Procedure Code(s):        --- Professional ---                           [phone], Colonoscopy, flexible; diagnostic, including                            collection of specimen(s) by brushing or washing,  when performed (separate procedure) Diagnosis Code(s):        --- Professional ---                           Z12.11, Encounter for screening for malignant                            neoplasm of colon                           K64.0, First degree hemorrhoids                           K57.30, Diverticulosis of large intestine without                            perforation or abscess without bleeding CPT copyright 2022 American Medical Association. All rights reserved. The codes documented in this report are preliminary and upon coder review may  be revised to meet current compliance requirements. Franky MachoMark Maebell Lyvers, MD Franky MachoMark Yomaris Palecek, MD 11/04/2022 8:14:35 AM This report has been signed electronically. Number of Addenda: 0

## 2022-11-04 NOTE — Interval H&P Note (Signed)
History and Physical Interval Note:  11/04/2022 7:29 AM  Thomas Benjamin  has presented today for surgery, with the diagnosis of SCREENING FOR COLON CANCER.  The various methods of treatment have been discussed with the patient and family. After consideration of risks, benefits and other options for treatment, the patient has consented to  Procedure(s): COLONOSCOPY WITH PROPOFOL (N/A) as a surgical intervention.  The patient's history has been reviewed, patient examined, no change in status, stable for surgery.  I have reviewed the patient's chart and labs.  Questions were answered to the patient's satisfaction.     Franky Macho

## 2022-11-04 NOTE — Anesthesia Preprocedure Evaluation (Signed)
Anesthesia Evaluation  Patient identified by MRN, date of birth, ID band Patient awake    Reviewed: Allergy & Precautions, H&P , NPO status , Patient's Chart, lab work & pertinent test results, reviewed documented beta blocker date and time   Airway Mallampati: II  TM Distance: >3 FB Neck ROM: full    Dental no notable dental hx.    Pulmonary neg pulmonary ROS   Pulmonary exam normal breath sounds clear to auscultation       Cardiovascular Exercise Tolerance: Good negative cardio ROS  Rhythm:regular Rate:Normal     Neuro/Psych negative neurological ROS  negative psych ROS   GI/Hepatic negative GI ROS, Neg liver ROS,,,  Endo/Other  negative endocrine ROS    Renal/GU negative Renal ROS  negative genitourinary   Musculoskeletal   Abdominal   Peds  Hematology negative hematology ROS (+)   Anesthesia Other Findings   Reproductive/Obstetrics negative OB ROS                             Anesthesia Physical Anesthesia Plan  ASA: 1  Anesthesia Plan: General   Post-op Pain Management:    Induction:   PONV Risk Score and Plan: Propofol infusion  Airway Management Planned:   Additional Equipment:   Intra-op Plan:   Post-operative Plan:   Informed Consent: I have reviewed the patients History and Physical, chart, labs and discussed the procedure including the risks, benefits and alternatives for the proposed anesthesia with the patient or authorized representative who has indicated his/her understanding and acceptance.     Dental Advisory Given  Plan Discussed with: CRNA  Anesthesia Plan Comments:        Anesthesia Quick Evaluation  

## 2022-11-06 NOTE — Anesthesia Postprocedure Evaluation (Signed)
Anesthesia Post Note  Patient: Thomas Benjamin  Procedure(s) Performed: COLONOSCOPY WITH PROPOFOL  Patient location during evaluation: Phase II Anesthesia Type: General Level of consciousness: awake Pain management: pain level controlled Vital Signs Assessment: post-procedure vital signs reviewed and stable Respiratory status: spontaneous breathing and respiratory function stable Cardiovascular status: blood pressure returned to baseline and stable Postop Assessment: no headache and no apparent nausea or vomiting Anesthetic complications: no Comments: Late entry   No notable events documented.   Last Vitals:  Vitals:   11/04/22 0743 11/04/22 0745  BP: (!) 93/43 102/62  Pulse: 70 76  Resp: 18 14  Temp: 36.7 C   SpO2: 94%     Last Pain:  Vitals:   11/04/22 0743  TempSrc: Oral  PainSc: 0-No pain                 Windell Norfolk

## 2022-11-11 ENCOUNTER — Encounter (HOSPITAL_COMMUNITY): Payer: Self-pay | Admitting: General Surgery

## 2023-06-29 DIAGNOSIS — H5203 Hypermetropia, bilateral: Secondary | ICD-10-CM | POA: Diagnosis not present

## 2023-09-17 DIAGNOSIS — Z9079 Acquired absence of other genital organ(s): Secondary | ICD-10-CM | POA: Diagnosis not present

## 2023-09-17 DIAGNOSIS — C61 Malignant neoplasm of prostate: Secondary | ICD-10-CM | POA: Diagnosis not present

## 2023-09-17 DIAGNOSIS — N5231 Erectile dysfunction following radical prostatectomy: Secondary | ICD-10-CM | POA: Diagnosis not present
